# Patient Record
Sex: Male | Born: 1970 | Race: White | Hispanic: No | Marital: Married | Smoking: Former smoker
Health system: Southern US, Community
[De-identification: ages and names within clinical notes are randomized; demographics above are authoritative.]

## PROBLEM LIST (undated history)

## (undated) DIAGNOSIS — E785 Hyperlipidemia, unspecified: Secondary | ICD-10-CM

## (undated) DIAGNOSIS — I10 Essential (primary) hypertension: Secondary | ICD-10-CM

## (undated) DIAGNOSIS — F419 Anxiety disorder, unspecified: Secondary | ICD-10-CM

## (undated) HISTORY — PX: TONSILLECTOMY: SUR1361

## (undated) HISTORY — DX: Hyperlipidemia, unspecified: E78.5

## (undated) HISTORY — DX: Anxiety disorder, unspecified: F41.9

## (undated) HISTORY — DX: Essential (primary) hypertension: I10

---

## 1999-05-22 ENCOUNTER — Encounter: Payer: Self-pay | Admitting: Internal Medicine

## 1999-05-22 ENCOUNTER — Emergency Department (HOSPITAL_COMMUNITY): Admission: EM | Admit: 1999-05-22 | Discharge: 1999-05-23 | Payer: Self-pay | Admitting: Internal Medicine

## 1999-05-25 ENCOUNTER — Emergency Department (HOSPITAL_COMMUNITY): Admission: EM | Admit: 1999-05-25 | Discharge: 1999-05-25 | Payer: Self-pay

## 1999-06-02 ENCOUNTER — Emergency Department (HOSPITAL_COMMUNITY): Admission: EM | Admit: 1999-06-02 | Discharge: 1999-06-02 | Payer: Self-pay | Admitting: Internal Medicine

## 2003-11-01 ENCOUNTER — Emergency Department (HOSPITAL_COMMUNITY): Admission: EM | Admit: 2003-11-01 | Discharge: 2003-11-01 | Payer: Self-pay | Admitting: Emergency Medicine

## 2004-07-21 ENCOUNTER — Encounter: Admission: RE | Admit: 2004-07-21 | Discharge: 2004-07-21 | Payer: Self-pay | Admitting: Gastroenterology

## 2004-09-03 ENCOUNTER — Encounter: Admission: RE | Admit: 2004-09-03 | Discharge: 2004-09-03 | Payer: Self-pay | Admitting: Family Medicine

## 2005-02-03 ENCOUNTER — Encounter: Admission: RE | Admit: 2005-02-03 | Discharge: 2005-02-03 | Payer: Self-pay | Admitting: Neurology

## 2005-02-04 ENCOUNTER — Encounter: Admission: RE | Admit: 2005-02-04 | Discharge: 2005-02-04 | Payer: Self-pay | Admitting: Neurology

## 2005-03-08 ENCOUNTER — Emergency Department (HOSPITAL_COMMUNITY): Admission: EM | Admit: 2005-03-08 | Discharge: 2005-03-08 | Payer: Self-pay | Admitting: Emergency Medicine

## 2005-03-18 ENCOUNTER — Encounter: Admission: RE | Admit: 2005-03-18 | Discharge: 2005-03-18 | Payer: Self-pay | Admitting: Family Medicine

## 2006-05-15 IMAGING — CT CT CERVICAL SPINE W/O CM
2 series · 10 of 14 positions shown, 12 images · IV contrast (agent unspecified)
Comparison: None available.

CLINICAL DATA: 34-year-old with headache.  Anxiety.  Pain.  No trauma. 
HEAD CT WITHOUT CONTRAST:
TECHNIQUE: Contiguous axial images were obtained from the base of the skull through the vertex according to standard protocol without contrast.
TECHNIQUE: Multidetector CT imaging of the cervical spine was performed.  Multiplanar CT image reconstructions were also generated.

[Series 3: head_seq 4.5 h42s st · axial · 0.43mm/px · z∈[+1193,+1238]mm · 2 of 32 slices shown]
[im 11/32  bone]
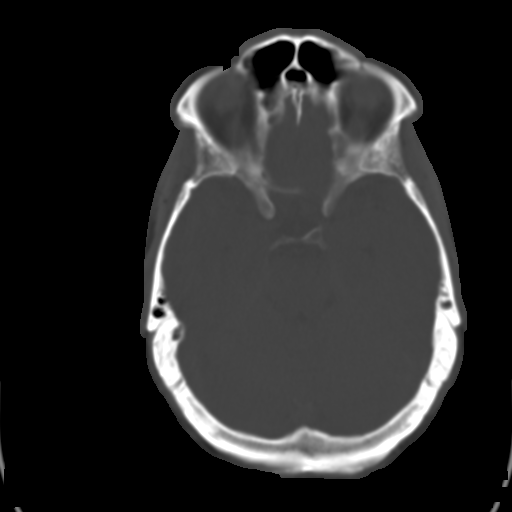
[im 21/32  bone]
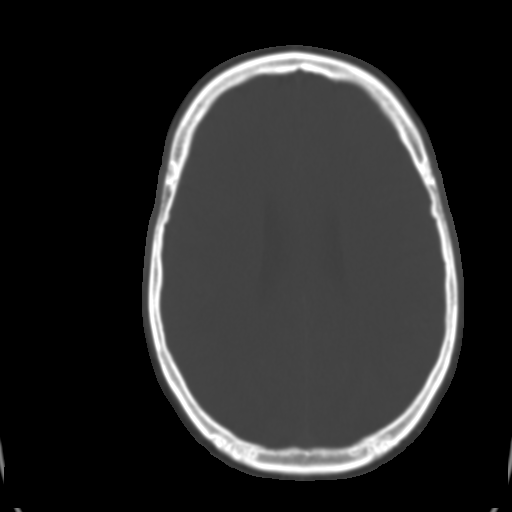

[Series 4: c_spine 2.0 b20s · axial · 0.27mm/px · z∈[+1024,+1172]mm · 8 of 96 slices shown, 10 images]
[im 11/96  soft-tissue]
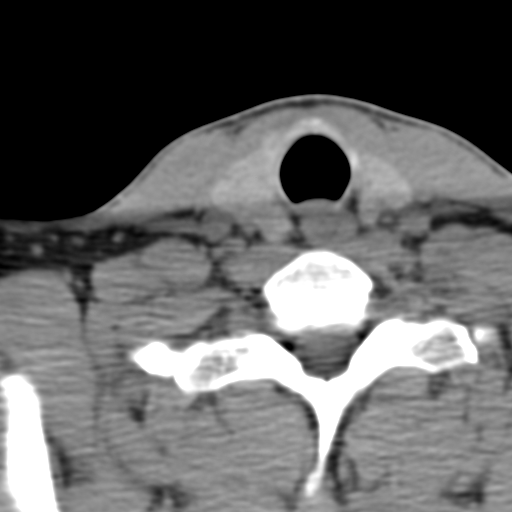
[im 11/96  bone]
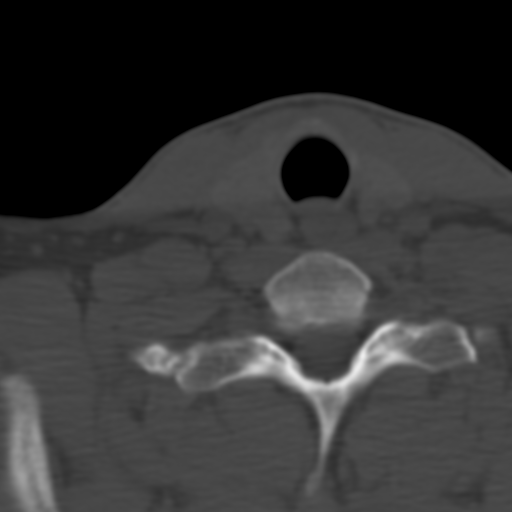
[im 22/96  bone]
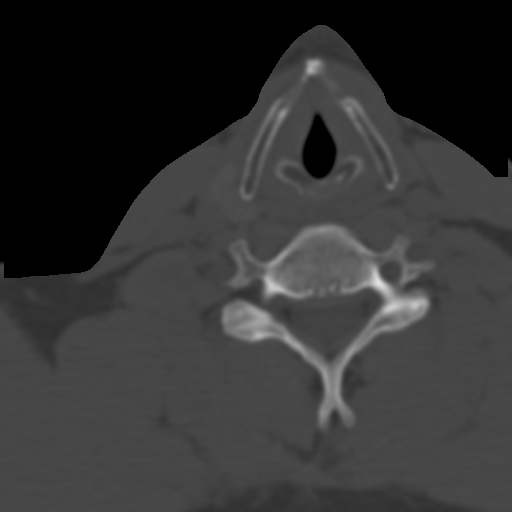
[im 32/96  bone]
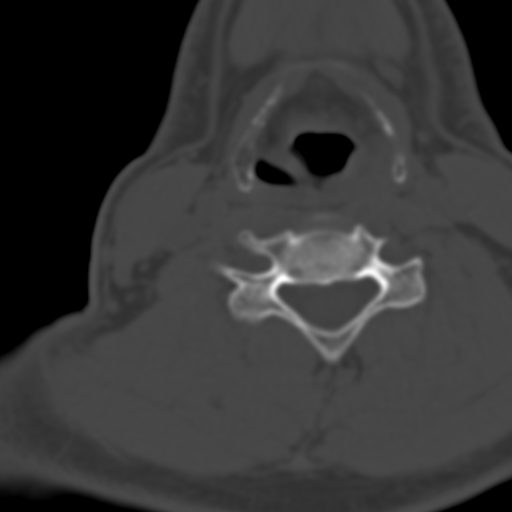
[im 43/96  bone]
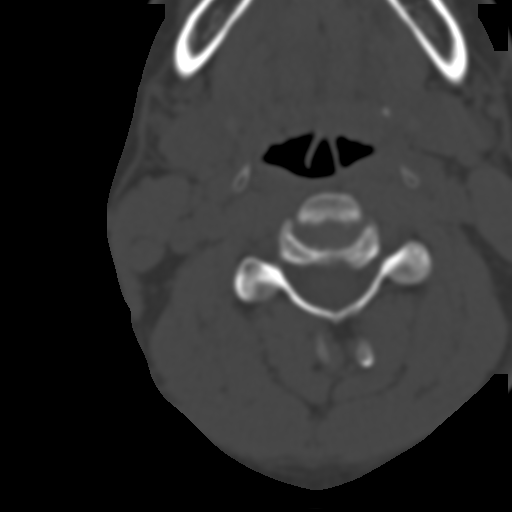
[im 53/96  soft-tissue]
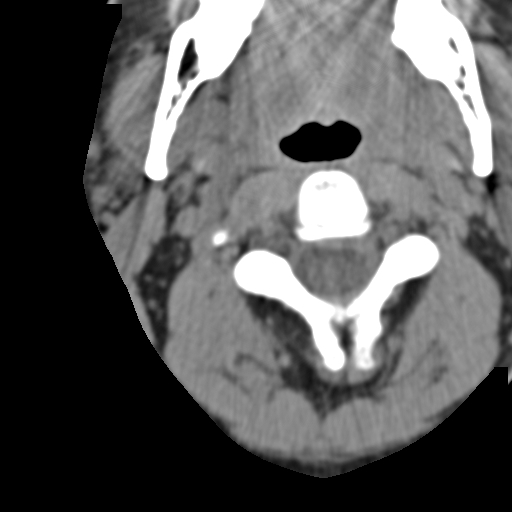
[im 53/96  bone]
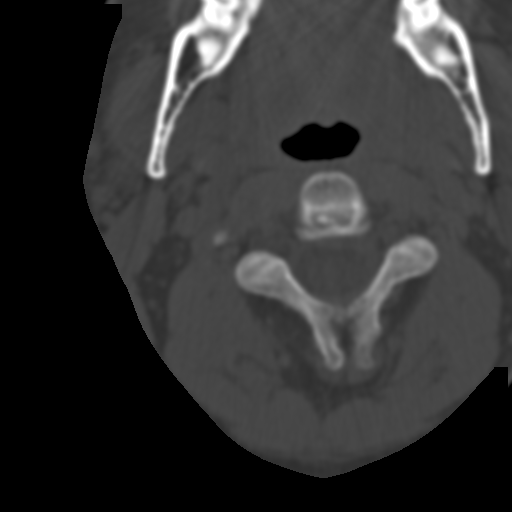
[im 64/96  bone]
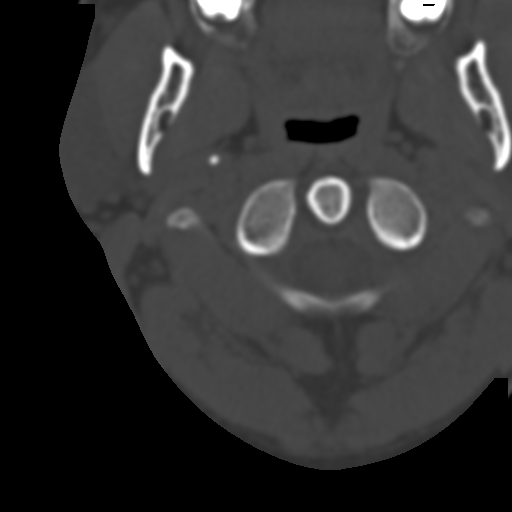
[im 74/96  bone]
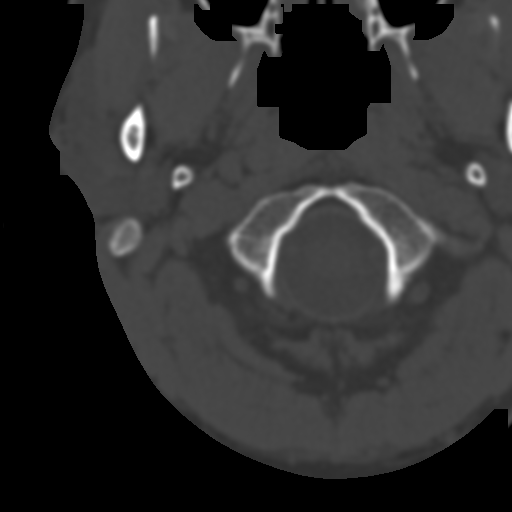
[im 85/96  bone]
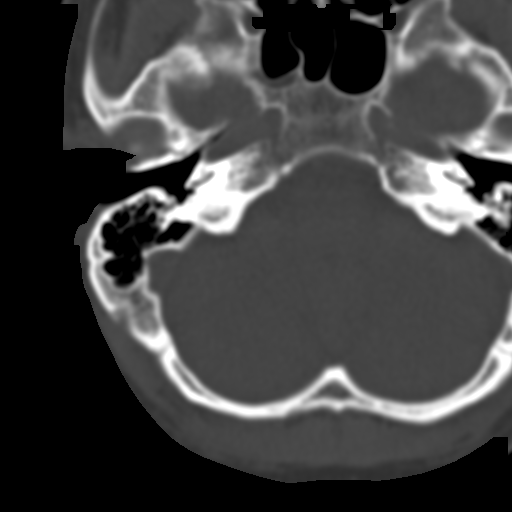

[10 of 14 positions shown; findings below may reference images not displayed]

There is no evidence of intracranial hemorrhage, brain edema, or mass effect.  No other intra-axial abnormalities are seen, and the ventricles are within normal limits.  No abnormal extra-axial fluid collections or masses are identified.  No skull abnormalities are noted.
IMPRESSION: Negative non-contrast head CT.
CERVICAL SPINE CT WITHOUT CONTRAST:
FINDINGS: No cervical spine fractures are seen.  Alignment is normal.  Facets are normally aligned without significant facet disease.  No abnormal prevertebral soft tissue swelling.  The skull base C1 and C1-C2 articulations are normal.  The dens appears normal.  There is degenerative disc disease at C5-6 with osteophytic ridging and a right paracentral disc protrusion.  There is mass effect on the right side of the thecal sac.  This could affect the right C6 nerve root.  Mild bony foraminal narrowing on the right side at this level also.  Neural foramen are otherwise patent at the other levels with no bony stenosis.
IMPRESSION: 1.  Degenerative disc disease at C5-6 with posterior osteophytic ridging and a right paracentral disc protrusion contributing to mass effect on the right side of the thecal sac.  This could affect the right C6 nerve root. 
2.  Normal alignment and no acute bony findings.

## 2012-10-07 ENCOUNTER — Other Ambulatory Visit: Payer: Self-pay | Admitting: Family Medicine

## 2012-10-07 DIAGNOSIS — Z139 Encounter for screening, unspecified: Secondary | ICD-10-CM

## 2012-10-17 ENCOUNTER — Other Ambulatory Visit: Payer: Self-pay

## 2012-10-17 ENCOUNTER — Ambulatory Visit
Admission: RE | Admit: 2012-10-17 | Discharge: 2012-10-17 | Disposition: A | Discharge status: Home / Self Care | Payer: 59 | Source: Ambulatory Visit | Attending: Family Medicine | Admitting: Family Medicine

## 2012-10-17 DIAGNOSIS — Z139 Encounter for screening, unspecified: Secondary | ICD-10-CM

## 2012-10-18 ENCOUNTER — Ambulatory Visit
Admission: RE | Admit: 2012-10-18 | Discharge: 2012-10-18 | Disposition: A | Discharge status: Home / Self Care | Payer: 59 | Source: Ambulatory Visit | Attending: Family Medicine | Admitting: Family Medicine

## 2012-10-24 ENCOUNTER — Other Ambulatory Visit: Payer: Self-pay | Admitting: *Deleted

## 2012-10-25 ENCOUNTER — Other Ambulatory Visit: Payer: Self-pay | Admitting: *Deleted

## 2012-10-25 DIAGNOSIS — M79609 Pain in unspecified limb: Secondary | ICD-10-CM

## 2012-10-26 ENCOUNTER — Encounter: Payer: Self-pay | Admitting: Surgery

## 2012-11-25 ENCOUNTER — Encounter: Payer: Self-pay | Admitting: Surgery

## 2012-11-28 ENCOUNTER — Other Ambulatory Visit: Payer: Self-pay | Admitting: Surgery

## 2012-11-28 ENCOUNTER — Ambulatory Visit (INDEPENDENT_AMBULATORY_CARE_PROVIDER_SITE_OTHER): Payer: 59 | Admitting: Surgery

## 2012-11-28 ENCOUNTER — Ambulatory Visit (HOSPITAL_COMMUNITY)
Admission: RE | Admit: 2012-11-28 | Discharge: 2012-11-28 | Disposition: A | Discharge status: Home / Self Care | Payer: 59 | Source: Ambulatory Visit | Attending: Surgery | Admitting: Surgery

## 2012-11-28 ENCOUNTER — Encounter: Payer: Self-pay | Admitting: Surgery

## 2012-11-28 ENCOUNTER — Other Ambulatory Visit: Payer: Self-pay | Admitting: *Deleted

## 2012-11-28 VITALS — BP 145/85 | HR 79 | Ht 69.0 in | Wt 186.3 lb

## 2012-11-28 DIAGNOSIS — M79609 Pain in unspecified limb: Secondary | ICD-10-CM | POA: Insufficient documentation

## 2012-11-28 NOTE — Progress Notes (Signed)
[facility]   Patient name: Carlos Hurst MRN: [id number] DOB: Apr 12, 1970 Sex: male   Referred by: Dr. Clarene Duke  Reason for referral:  Chief Complaint  Patient presents with  . New Evaluation    leg pain - pt states mostly in the left leg    HISTORY OF PRESENT ILLNESS: Pleasant 42 year old gentleman who is referred for evaluation of bilateral leg pain, left greater than right.  The patient reports that his symptoms began approximately 6 months ago.  He describes his symptoms as throbbing.  They are worse with walking.  However, he does have pain with sitting for prolonged periods.  Relief this with the use of Vicodin.  The patient also reports low back pain which also began 6 months ago.  He does have a history of falling off a ladder 8 months ago.  He suffered forward fractures at that time.  The patient denies having nonhealing ulcers.  He denies rest pain.  He does report that his father who was a smoker had peripheral vascular disease.  The patient is medically managed for hypertension.  He has hypercholesterolemia which he is trying to manage with his diet.  He is a nonsmoker.  Past Medical History  Diagnosis Date  . Hypertension   . Hyperlipidemia   . Anxiety     Past Surgical History  Procedure Laterality Date  . Tonsillectomy      History   Social History  . Marital Status: Married    Spouse Name: N/A    Number of Children: N/A  . Years of Education: N/A   Occupational History  . Not on file.   Social History Main Topics  . Smoking status: Former Smoker    Types: Cigarettes    Quit date: 11/28/1988  . Smokeless tobacco: Never Used  . Alcohol Use: No  . Drug Use: No  . Sexual Activity: Not on file   Other Topics Concern  . Not on file   Social History Narrative  . No narrative on file    Family History  Problem Relation Age of Onset  . Cancer Father   . Diabetes Father   . Heart attack Mother   . Peripheral vascular  disease Mother   . Deep vein thrombosis Mother     Allergies as of 11/28/2012  . (No Known Allergies)    Current Outpatient Prescriptions on File Prior to Visit  Medication Sig Dispense Refill  . B Complex-C (SUPER B COMPLEX PO) Take by mouth as directed.      . cholecalciferol (VITAMIN D) 1000 UNITS tablet Take 1,000 Units by mouth daily.      . clonazePAM (KLONOPIN) 1 MG tablet Take 1 mg by mouth 2 (two) times daily as needed for anxiety.      Marland Kitchen losartan (COZAAR) 50 MG tablet Take 50 mg by mouth daily.      . milk thistle 175 MG tablet Take 175 mg by mouth as directed.      . Multiple Vitamin (MULTIVITAMIN) tablet Take 1 tablet by mouth daily.      . Omega-3 Fatty Acids (FISH OIL PO) Take by mouth daily.      . Red Yeast Rice 600 MG TABS Take 1 tablet by mouth daily.       No current facility-administered medications on file prior to visit.     REVIEW OF SYSTEMS: Cardiovascular: No chest pain, chest pressure, palpitations, orthopnea, or dyspnea on exertion.  Positive for pain in  his legs with walking and lying flat Pulmonary: No productive cough, asthma or wheezing. Neurologic: Positive for weakness in his arms or legs.. Hematologic: No bleeding problems or clotting disorders. Musculoskeletal: No joint pain or joint swelling. Gastrointestinal: No blood in stool or hematemesis Genitourinary: No dysuria or hematuria. Psychiatric:: No history of major depression. Integumentary: No rashes or ulcers. Constitutional: No fever or chills.  PHYSICAL EXAMINATION: General: The patient appears their stated age.  Vital signs are BP 145/85  Pulse 79  Ht 5\' 9"  (1.753 m)  Wt 186 lb 4.8 oz (84.505 kg)  BMI 27.50 kg/m2  SpO2 100% HEENT:  No gross abnormalities Pulmonary: Respirations are non-labored Abdomen: Soft and non-tender .  Aorta is nonpalpable Musculoskeletal: There are no major deformities.   Neurologic: No focal weakness or paresthesias are detected, Skin: There are no ulcer  or rashes noted. Psychiatric: The patient has normal affect. Cardiovascular: There is a regular rate and rhythm without significant murmur appreciated.  No carotid bruits.  Palpable femoral pulses and popliteal pulses bilaterally.  Palpable dorsalis pedis and posterior tibial arteries bilaterally.  Diagnostic Studies: MRI: 1. Left L4-L5 disk degeneration with shallow broad-based disk bulge  just contacting the descending left L5 nerve as it approaches the  lateral recess potentially producing irritation.  2. L5-S1 left eccentric degenerative disk disease. Right L5-S1  foraminal stenosis potentially affecting the right L5 nerve of  questionable significance in this patient with left lower extremity  radicular symptoms  Arterial duplex: ABIs are normal (1.1) with triphasic waveforms. Exercise testing: The patient maintained an ankle brachial index greater than 1 bilaterally with exercise.  Interestingly, however the pressure on the right leg did decrease and 24 minutes to recover.  The waveforms went from triphasic to biphasic.  On the left, the pressure actually increased.     Assessment:  Bilateral leg pain, left greater than right  Plan: Based on the patient's physical exam and imaging studies, I do not feel that his symptoms are related to arterial insufficiency.  I have reassured the patient that he has no evidence of atherosclerotic disease at this time.  We discussed the importance of medical management which would include no smoking, hypertension control, and cholesterol management with a statin if he fails dietary management.  We also discussed the importance of checking his pedal pulses periodically.  He will follow up on a when necessary basis.   I reviewed the patients exercise studies with him.  There was no evidence of arterial insufficiency on the left leg with exercise.  His left leg is his symptomatic leg.  There was a slight decrease in blood flow on the right however this  leg is not bothering him.  We decided to repeat the study in approximately 2 years or sooner if the patient has problems.  I did stress to him the importance of medical management.  His blood pressure needs to be regulated.  I discussed adding a baby aspirin to his medical regimen.  In addition, if his cholesterol profile has not improved with diet and exercise.  I strongly encouraged him to be started on a statin.  He will discuss this with Dr. Laural Golden in approximately 3 months when he meets with him again.  Jorge Ny, M.D. [facility] [place] Office: [phone] Pager:  [phone]

## 2014-12-03 ENCOUNTER — Ambulatory Visit: Payer: 59 | Admitting: Surgery

## 2014-12-03 ENCOUNTER — Encounter (HOSPITAL_COMMUNITY): Payer: Self-pay

## 2015-01-04 ENCOUNTER — Other Ambulatory Visit: Payer: Self-pay | Admitting: Family Medicine

## 2015-01-04 DIAGNOSIS — R748 Abnormal levels of other serum enzymes: Secondary | ICD-10-CM

## 2015-01-15 ENCOUNTER — Ambulatory Visit
Admission: RE | Admit: 2015-01-15 | Discharge: 2015-01-15 | Disposition: A | Discharge status: Home / Self Care | Payer: BLUE CROSS/BLUE SHIELD | Source: Ambulatory Visit | Attending: Family Medicine | Admitting: Family Medicine

## 2015-01-15 DIAGNOSIS — R748 Abnormal levels of other serum enzymes: Secondary | ICD-10-CM

## 2015-01-24 ENCOUNTER — Encounter (HOSPITAL_BASED_OUTPATIENT_CLINIC_OR_DEPARTMENT_OTHER): Payer: Self-pay

## 2015-01-24 ENCOUNTER — Ambulatory Visit (HOSPITAL_BASED_OUTPATIENT_CLINIC_OR_DEPARTMENT_OTHER): Admit: 2015-01-24 | Payer: Self-pay | Admitting: Orthopedic Surgery

## 2015-01-24 SURGERY — ARTHROSCOPY, KNEE, WITH MEDIAL MENISCECTOMY
Anesthesia: General | Site: Knee | Laterality: Left

## 2015-02-01 ENCOUNTER — Encounter: Payer: Self-pay | Admitting: Surgery

## 2015-02-08 ENCOUNTER — Other Ambulatory Visit: Payer: Self-pay | Admitting: *Deleted

## 2015-02-08 DIAGNOSIS — M79604 Pain in right leg: Secondary | ICD-10-CM

## 2015-02-08 DIAGNOSIS — M79605 Pain in left leg: Principal | ICD-10-CM

## 2015-02-11 ENCOUNTER — Ambulatory Visit (HOSPITAL_COMMUNITY)
Admission: RE | Admit: 2015-02-11 | Discharge: 2015-02-11 | Disposition: A | Discharge status: Home / Self Care | Payer: BLUE CROSS/BLUE SHIELD | Source: Ambulatory Visit | Attending: Surgery | Admitting: Surgery

## 2015-02-11 ENCOUNTER — Encounter: Payer: Self-pay | Admitting: Surgery

## 2015-02-11 ENCOUNTER — Ambulatory Visit (INDEPENDENT_AMBULATORY_CARE_PROVIDER_SITE_OTHER): Payer: BLUE CROSS/BLUE SHIELD | Admitting: Surgery

## 2015-02-11 VITALS — BP 116/79 | HR 86 | Ht 69.0 in | Wt 176.0 lb

## 2015-02-11 DIAGNOSIS — M79604 Pain in right leg: Secondary | ICD-10-CM | POA: Diagnosis not present

## 2015-02-11 DIAGNOSIS — M79605 Pain in left leg: Secondary | ICD-10-CM | POA: Insufficient documentation

## 2015-02-11 DIAGNOSIS — M25562 Pain in left knee: Secondary | ICD-10-CM | POA: Diagnosis not present

## 2015-02-11 DIAGNOSIS — E785 Hyperlipidemia, unspecified: Secondary | ICD-10-CM | POA: Diagnosis not present

## 2015-02-11 DIAGNOSIS — M25561 Pain in right knee: Secondary | ICD-10-CM

## 2015-02-11 DIAGNOSIS — I10 Essential (primary) hypertension: Secondary | ICD-10-CM | POA: Insufficient documentation

## 2015-02-11 NOTE — Progress Notes (Signed)
Patient name: Carlos Hurst MRN: [id number] DOB: 1970-10-28 Sex: male     Chief Complaint  Patient presents with  . Re-evaluation    2 year f/u - c/o intermittent bilateral LE shooting pain in groin area    HISTORY OF PRESENT ILLNESS:  the patient is back for follow-up. I saw him several years ago.  He describes throbbing in his legs which was worse with walking.  He also had pain when he sat down and did report low back pain he does have a history of falling off a ladder.  He underwent  Vascular lab studies including an exercise test which were essentially normal. I reassured him and recommended lifestyle changes including better management of his cholesterol continue smoking cessation.   Currently he is doing very well.  He had a discussion with his primary care physician about starting a statin and elected not to. He currently is awaiting liver function studies because he has been found to have a fatty liver.  Past Medical History  Diagnosis Date  . Hypertension   . Hyperlipidemia   . Anxiety     Past Surgical History  Procedure Laterality Date  . Tonsillectomy      Social History   Social History  . Marital Status: Married    Spouse Name: N/A  . Number of Children: N/A  . Years of Education: N/A   Occupational History  . Not on file.   Social History Main Topics  . Smoking status: Former Smoker    Types: Cigarettes    Quit date: 11/28/1988  . Smokeless tobacco: Never Used  . Alcohol Use: No  . Drug Use: No  . Sexual Activity: Not on file   Other Topics Concern  . Not on file   Social History Narrative    Family History  Problem Relation Age of Onset  . Cancer Father   . Diabetes Father   . Heart attack Mother   . Peripheral vascular disease Mother   . Deep vein thrombosis Mother     Allergies as of 02/11/2015  . (No Known Allergies)    Current Outpatient Prescriptions on File Prior to Visit  Medication Sig Dispense Refill  . B Complex-C  (SUPER B COMPLEX PO) Take by mouth as directed.    . clonazePAM (KLONOPIN) 1 MG tablet Take 1 mg by mouth 2 (two) times daily as needed for anxiety.    Marland Kitchen losartan (COZAAR) 50 MG tablet Take 50 mg by mouth daily.    . milk thistle 175 MG tablet Take 175 mg by mouth as directed.    . Multiple Vitamin (MULTIVITAMIN) tablet Take 1 tablet by mouth daily.    . Omega-3 Fatty Acids (FISH OIL PO) Take by mouth daily.    . cholecalciferol (VITAMIN D) 1000 UNITS tablet Take 1,000 Units by mouth daily. Reported on 02/11/2015    . Red Yeast Rice 600 MG TABS Take 1 tablet by mouth daily. Reported on 02/11/2015     No current facility-administered medications on file prior to visit.     REVIEW OF SYSTEMS: Cardiovascular: No chest pain, chest pressure, palpitations, orthopnea, or dyspnea on exertion. No claudication or rest pain,  No history of DVT or phlebitis. Pulmonary: No productive cough, asthma or wheezing. Neurologic: No weakness, paresthesias, aphasia, or amaurosis. No dizziness. Hematologic: No bleeding problems or clotting disorders. Musculoskeletal: No joint pain or joint swelling. Gastrointestinal: No blood in stool or hematemesis Genitourinary: No dysuria or hematuria. Psychiatric:: No  history of major depression. Integumentary: No rashes or ulcers. Constitutional: No fever or chills.  PHYSICAL EXAMINATION:   Vital signs are  Filed Vitals:   02/11/15 1054  BP: 116/79  Pulse: 86  Height:  (1.753 m)  Weight: 176 lb (79.833 kg)  SpO2: 96%   Body mass index is 25.98 kg/(m^2). General: The patient appears their stated age. HEENT:  No gross abnormalities Pulmonary:  Non labored breathing Musculoskeletal: There are no major deformities. Neurologic: No focal weakness or paresthesias are detected, Skin: There are no ulcer or rashes noted. Psychiatric: The patient has normal affect. Cardiovascular: There is a regular rate and rhythm without significant murmur appreciated. Palpable  pedal pulses   Diagnostic Studies  I have reviewed his ankle brachial indices today.  They are normal with triphasic waveform  Assessment:  leg pain Plan:  I have reassured the patient that I do not think his symptoms are related to vascular disease. We discussed the signs and symptoms of claudication and to contact me should these occur. His most recent lab work showed elevated triglycerides.  I have encouraged him to discuss this further with his medical doctors and try to get this under control. He will follow up on an as-needed basis.  Jorge Ny, M.D. [facility] [place] Office: [phone] Pager:  [phone]

## 2018-02-19 DEATH — deceased
